# Patient Record
Sex: Female | Born: 1995 | Hispanic: No | Marital: Single | Smoking: Never smoker
Health system: Southern US, Community
[De-identification: ages and names within clinical notes are randomized; demographics above are authoritative.]

## PROBLEM LIST (undated history)

## (undated) HISTORY — PX: CYST EXCISION: SHX5701

---

## 2013-11-08 HISTORY — PX: CYST EXCISION: SHX5701

## 2016-10-17 ENCOUNTER — Emergency Department: Payer: BLUE CROSS/BLUE SHIELD

## 2016-10-17 ENCOUNTER — Inpatient Hospital Stay
Admission: EM | Admit: 2016-10-17 | Discharge: 2016-10-18 | DRG: 871 | Disposition: A | Discharge status: Home / Self Care | Payer: BLUE CROSS/BLUE SHIELD | Attending: Internal Medicine | Admitting: Internal Medicine

## 2016-10-17 DIAGNOSIS — Z793 Long term (current) use of hormonal contraceptives: Secondary | ICD-10-CM

## 2016-10-17 DIAGNOSIS — R509 Fever, unspecified: Secondary | ICD-10-CM | POA: Diagnosis not present

## 2016-10-17 DIAGNOSIS — A419 Sepsis, unspecified organism: Secondary | ICD-10-CM | POA: Diagnosis not present

## 2016-10-17 DIAGNOSIS — E871 Hypo-osmolality and hyponatremia: Secondary | ICD-10-CM | POA: Diagnosis present

## 2016-10-17 DIAGNOSIS — J189 Pneumonia, unspecified organism: Secondary | ICD-10-CM | POA: Diagnosis present

## 2016-10-17 DIAGNOSIS — D709 Neutropenia, unspecified: Secondary | ICD-10-CM | POA: Diagnosis present

## 2016-10-17 DIAGNOSIS — R5081 Fever presenting with conditions classified elsewhere: Secondary | ICD-10-CM | POA: Diagnosis present

## 2016-10-17 LAB — CBC WITH DIFFERENTIAL/PLATELET
BAND NEUTROPHILS: 2 %
BASOS ABS: 0 10*3/uL (ref 0–0.1)
BLASTS: 0 %
Basophils Relative: 0 %
EOS ABS: 0 10*3/uL (ref 0–0.7)
Eosinophils Relative: 0 %
HEMATOCRIT: 38.8 % (ref 35.0–47.0)
HEMOGLOBIN: 13.6 g/dL (ref 12.0–16.0)
LYMPHS PCT: 57 %
Lymphs Abs: 1.3 10*3/uL (ref 1.0–3.6)
MCH: 29.4 pg (ref 26.0–34.0)
MCHC: 35 g/dL (ref 32.0–36.0)
MCV: 84 fL (ref 80.0–100.0)
Metamyelocytes Relative: 2 %
Monocytes Absolute: 0.8 10*3/uL (ref 0.2–0.9)
Monocytes Relative: 36 %
Myelocytes: 0 %
NEUTROS PCT: 3 %
Neutro Abs: 0.2 10*3/uL — ABNORMAL LOW (ref 1.4–6.5)
OTHER: 0 %
PROMYELOCYTES ABS: 0 %
Platelets: 213 10*3/uL (ref 150–440)
RBC: 4.62 MIL/uL (ref 3.80–5.20)
RDW: 12.3 % (ref 11.5–14.5)
WBC: 2.3 10*3/uL — AB (ref 3.6–11.0)
nRBC: 0 /100 WBC

## 2016-10-17 LAB — URINALYSIS, COMPLETE (UACMP) WITH MICROSCOPIC
BILIRUBIN URINE: NEGATIVE
GLUCOSE, UA: NEGATIVE mg/dL
KETONES UR: 80 mg/dL — AB
LEUKOCYTES UA: NEGATIVE
NITRITE: NEGATIVE
PROTEIN: 100 mg/dL — AB
Specific Gravity, Urine: 1.014 (ref 1.005–1.030)
pH: 5 (ref 5.0–8.0)

## 2016-10-17 LAB — BASIC METABOLIC PANEL
ANION GAP: 12 (ref 5–15)
BUN: 11 mg/dL (ref 6–20)
CHLORIDE: 102 mmol/L (ref 101–111)
CO2: 20 mmol/L — ABNORMAL LOW (ref 22–32)
Calcium: 9.3 mg/dL (ref 8.9–10.3)
Creatinine, Ser: 0.78 mg/dL (ref 0.44–1.00)
GFR calc non Af Amer: >60 mL/min (ref >60)
Glucose, Bld: 107 mg/dL — ABNORMAL HIGH (ref 65–99)
Potassium: 3.6 mmol/L (ref 3.5–5.1)
Sodium: 134 mmol/L — ABNORMAL LOW (ref 135–145)

## 2016-10-17 LAB — POCT RAPID STREP A: Streptococcus, Group A Screen (Direct): NEGATIVE

## 2016-10-17 LAB — LACTIC ACID, PLASMA: Lactic Acid, Venous: 1 mmol/L (ref 0.5–1.9)

## 2016-10-17 MED ORDER — AZITHROMYCIN 250 MG PO TABS
ORAL_TABLET | ORAL | Status: AC
  Administered 2016-10-17: 500 mg via ORAL
  Filled 2016-10-17: qty 2

## 2016-10-17 MED ORDER — MAGNESIUM CITRATE PO SOLN
1.0000 | Freq: Once | ORAL | Status: DC | PRN
  Filled 2016-10-17: qty 296

## 2016-10-17 MED ORDER — ZOLPIDEM TARTRATE 5 MG PO TABS: 5.0000 mg | ORAL_TABLET | Freq: Every evening | ORAL | Status: DC | PRN

## 2016-10-17 MED ORDER — SODIUM CHLORIDE 0.9 % IV SOLN
INTRAVENOUS | Status: DC
  Administered 2016-10-17 – 2016-10-18 (×2): via INTRAVENOUS

## 2016-10-17 MED ORDER — DEXTROSE 5 % IV SOLN: 1.0000 g | Freq: Once | INTRAVENOUS | Status: DC

## 2016-10-17 MED ORDER — BISACODYL 5 MG PO TBEC: 5.0000 mg | DELAYED_RELEASE_TABLET | Freq: Every day | ORAL | Status: DC | PRN

## 2016-10-17 MED ORDER — CEFTRIAXONE SODIUM-DEXTROSE 1-3.74 GM-% IV SOLR
INTRAVENOUS | Status: AC
  Administered 2016-10-17: 1 g via INTRAVENOUS
  Filled 2016-10-17: qty 50

## 2016-10-17 MED ORDER — AZITHROMYCIN 250 MG PO TABS
500.0000 mg | ORAL_TABLET | Freq: Once | ORAL | Status: AC
  Administered 2016-10-17: 500 mg via ORAL

## 2016-10-17 MED ORDER — DEXTROSE 5 % IV SOLN
500.0000 mg | INTRAVENOUS | Status: DC
  Filled 2016-10-17: qty 500

## 2016-10-17 MED ORDER — SENNOSIDES-DOCUSATE SODIUM 8.6-50 MG PO TABS: 1.0000 | ORAL_TABLET | Freq: Every evening | ORAL | Status: DC | PRN

## 2016-10-17 MED ORDER — SODIUM CHLORIDE 0.9% FLUSH: 3.0000 mL | Freq: Two times a day (BID) | INTRAVENOUS | Status: DC

## 2016-10-17 MED ORDER — ACETAMINOPHEN 650 MG RE SUPP: 650.0000 mg | Freq: Four times a day (QID) | RECTAL | Status: DC | PRN

## 2016-10-17 MED ORDER — CEFTRIAXONE SODIUM-DEXTROSE 1-3.74 GM-% IV SOLR
1.0000 g | INTRAVENOUS | Status: DC
  Filled 2016-10-17: qty 50

## 2016-10-17 MED ORDER — DM-GUAIFENESIN ER 30-600 MG PO TB12: 1.0000 | ORAL_TABLET | Freq: Two times a day (BID) | ORAL | Status: DC

## 2016-10-17 MED ORDER — GUAIFENESIN ER 600 MG PO TB12
600.0000 mg | ORAL_TABLET | Freq: Two times a day (BID) | ORAL | Status: DC
  Administered 2016-10-17 – 2016-10-18 (×2): 600 mg via ORAL
  Filled 2016-10-17 (×2): qty 1

## 2016-10-17 MED ORDER — DEXTROMETHORPHAN POLISTIREX ER 30 MG/5ML PO SUER
30.0000 mg | Freq: Two times a day (BID) | ORAL | Status: DC
  Administered 2016-10-17 – 2016-10-18 (×2): 30 mg via ORAL
  Filled 2016-10-17 (×3): qty 5

## 2016-10-17 MED ORDER — ONDANSETRON HCL 4 MG PO TABS
4.0000 mg | ORAL_TABLET | Freq: Four times a day (QID) | ORAL | Status: DC | PRN
Start: 2016-10-17 — End: 2016-10-18

## 2016-10-17 MED ORDER — IPRATROPIUM-ALBUTEROL 0.5-2.5 (3) MG/3ML IN SOLN: 3.0000 mL | Freq: Four times a day (QID) | RESPIRATORY_TRACT | Status: DC | PRN

## 2016-10-17 MED ORDER — ACETAMINOPHEN 325 MG PO TABS
650.0000 mg | ORAL_TABLET | Freq: Four times a day (QID) | ORAL | Status: DC | PRN
  Administered 2016-10-17: 650 mg via ORAL
  Filled 2016-10-17: qty 2

## 2016-10-17 MED ORDER — ONDANSETRON HCL 4 MG/2ML IJ SOLN: 4.0000 mg | Freq: Four times a day (QID) | INTRAMUSCULAR | Status: DC | PRN

## 2016-10-17 MED ORDER — CEFTRIAXONE SODIUM-DEXTROSE 1-3.74 GM-% IV SOLR
1.0000 g | Freq: Once | INTRAVENOUS | Status: AC
  Administered 2016-10-17: 1 g via INTRAVENOUS
  Filled 2016-10-17: qty 50

## 2016-10-17 NOTE — ED Provider Notes (Signed)
[facility] Emergency Department Provider Note  ____________________________________________  Time seen: Approximately 5:51 PM  I have reviewed the triage vital signs and the nursing notes.   HISTORY  Chief Complaint Fever   HPI Abigail Pennington is a 20 y.o. female with no significant past medical history who presents for evaluation of fever and headache. Patient reports that her symptoms started yesterday. She has had a fever as high as 104F. patient reports that she had a severe headache, diffuse, throbbing, nonradiating, constant for most of the day today. She reports that [facility] to [facility] and was given 800 mg of Motrin at 4 PM, had flu and strep which were both negative and was sent here for further evaluation. Patient endorses mild sore throat and a dry cough for the last 2 days as well. She tells me that she feels great now and that HA has fully resolved after motrin. She denies neck stiffness, rash, shortness of breath, chest pain, changes in vision, abdominal pain, diarrhea. Patient did have nausea and 2 episodes of vomiting while her fever was really high. She denies any nausea at this time. Patient's vaccinations are up-to-date including influenza. 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  History reviewed. No pertinent past medical history.  Patient Active Problem List   Diagnosis Date Noted  . Sepsis due to pneumonia (HCC) 10/17/2016    Past Surgical History:  Procedure Laterality Date  . CYST EXCISION      Prior to Admission medications   Not on File    Allergies Patient has no known allergies.  No family history on file.  Social History Social History  Substance Use Topics  . Smoking status: Never Smoker  . Smokeless tobacco: Never Used  . Alcohol use No    Review of Systems  Constitutional: + fever. Eyes: Negative for visual changes. ENT: + sore throat. Neck: No neck pain   Cardiovascular: Negative for chest pain. Respiratory: Negative for shortness of breath. + cough Gastrointestinal: Negative for abdominal pain, vomiting or diarrhea. Genitourinary: Negative for dysuria. Musculoskeletal: Negative for back pain. Skin: Negative for rash. Neurological: Negative for weakness or numbness. + HA Psych: No SI or HI  ____________________________________________   PHYSICAL EXAM:  VITAL SIGNS: ED Triage Vitals  Enc Vitals Group     BP 10/17/16 1643 118/81     Pulse Rate 10/17/16 1643 (!) 145     Resp 10/17/16 1643 18     Temp 10/17/16 1643 (!) 102.6 F (39.2 C)     Temp Source 10/17/16 1643 Oral     SpO2 10/17/16 1643 96 %     Weight 10/17/16 1644 111 lb (50.3 kg)     Height 10/17/16 1644 5' 5.75" (1.67 m)     Head Circumference --      Peak Flow --      Pain Score 10/17/16 1644 2     Pain Loc --      Pain Edu? --      Excl. in GC? --     Constitutional: Alert and oriented. Well appearing and in no apparent distress. HEENT:      Head: Normocephalic and atraumatic.         Eyes: Conjunctivae are normal. Sclera is non-icteric. EOMI. PERRL      Mouth/Throat: Mucous membranes are moist. Oropharynx is erythematous with small amount of exudates, no evidence of peritonsillar abscess  Neck: Supple with no signs of meningismus. Negative jolt accentuation maneuver Cardiovascular: Regular rate and rhythm. No murmurs, gallops, or rubs. 2+ symmetrical distal pulses are present in all extremities. No JVD. Respiratory: Normal respiratory effort. Lungs are clear to auscultation bilaterally. No wheezes, crackles, or rhonchi.  Gastrointestinal: Soft, non tender, and non distended with positive bowel sounds. No rebound or guarding. Musculoskeletal: Nontender with normal range of motion in all extremities. No edema, cyanosis, or erythema of extremities. Neurologic: Normal speech and language. A & O x3, PERRL, no nystagmus, CN II-XII intact, motor testing reveals  good tone and bulk throughout. There is no evidence of pronator drift or dysmetria. Muscle strength is 5/5 throughout. Deep tendon reflexes are 2+ throughout with downgoing toes. Sensory examination is intact. Gait is normal. Skin: Skin is warm, dry and intact. No rash noted. Psychiatric: Mood and affect are normal. Speech and behavior are normal.  ____________________________________________   LABS (all labs ordered are listed, but only abnormal results are displayed)  Labs Reviewed  CBC WITH DIFFERENTIAL/PLATELET - Abnormal; Notable for the following:       Result Value   WBC 2.3 (*)    Neutro Abs 0.2 (*)    All other components within normal limits  BASIC METABOLIC PANEL - Abnormal; Notable for the following:    Sodium 134 (*)    CO2 20 (*)    Glucose, Bld 107 (*)    All other components within normal limits  URINALYSIS, COMPLETE (UACMP) WITH MICROSCOPIC - Abnormal; Notable for the following:    Color, Urine YELLOW (*)    APPearance HAZY (*)    Hgb urine dipstick MODERATE (*)    Ketones, ur 80 (*)    Protein, ur 100 (*)    Bacteria, UA RARE (*)    Squamous Epithelial / LPF 6-30 (*)    All other components within normal limits  CULTURE, GROUP A STREP (THRC)  LACTIC ACID, PLASMA  POCT RAPID STREP A   ____________________________________________  EKG  none ____________________________________________  RADIOLOGY  CXR : Subtle airspace consolidation the right middle lobe which may represent lobar pneumonia. Alternatively similar appearance can be seen with pectus excavatum. ____________________________________________   PROCEDURES  Procedure(s) performed: None Procedures Critical Care performed: yes  CRITICAL CARE Performed by: Nita Sickle  ?  Total critical care time: 35 min  Critical care time was exclusive of separately billable procedures and treating other patients.  Critical care was necessary to treat or prevent imminent or life-threatening  deterioration.  Critical care was time spent personally by me on the following activities: development of treatment plan with patient and/or surrogate as well as nursing, discussions with consultants, evaluation of patient's response to treatment, examination of patient, obtaining history from patient or surrogate, ordering and performing treatments and interventions, ordering and review of laboratory studies, ordering and review of radiographic studies, pulse oximetry and re-evaluation of patient's condition.  ____________________________________________   INITIAL IMPRESSION / ASSESSMENT AND PLAN / ED COURSE  20 y.o. female with no significant past medical history who presents for evaluation of fever,headache, sore throat and cough. Patient is extremely well-appearing, neurologically intact, no meningeal signs, no neck stiffness, negative jolt accentuation maneuver, no rash. She does have small amount of exudates and erythema of her oropharynx plus the coughing makes me concerned for a viral URI. I did discuss meningitis as a potential diagnosis with the patient however she is so well-appearing and I have very low suspicion at this time. I will repeat the strep,  the chest x-ray, basic blood work and continue to monitor patient closely.  Clinical Course as of Oct 17 2136  Sun Oct 17, 2016  1917 CXR concerning for PNA, WBC 2.3 (pending diff at this time), CXR concerning for PNA. Strep and lactic negative. I offered to patient and mother admission for IV abx and obs overnight however patient reports that she feels great and she wants to go home, she remains with no meningeal signs, no headache, neurologically intact otherwise. Mother feels comfortable taking her home. She will [facility]. I have discussed very strict return precautions with family. I will wait for the results of the differential to make sure patient ANC is normal befaore discharge. Will start patient  on Augmentin.  [CV]  2024 Patient is neutropenic with ANC of 0.2 therefore we'll admit for IV antibiotics.  [CV]    Clinical Course User Index [CV] Nita Sicklearolina Chrishun Scheer, MD    Pertinent labs & imaging results that were available during my care of the patient were reviewed by me and considered in my medical decision making (see chart for details).    ____________________________________________   FINAL CLINICAL IMPRESSION(S) / ED DIAGNOSES  Final diagnoses:  Neutropenic fever (HCC)  Community acquired pneumonia, unspecified laterality      NEW MEDICATIONS STARTED DURING THIS VISIT:  New Prescriptions   No medications on file     Note:  This document was prepared using Dragon voice recognition software and may include unintentional dictation errors.    Nita Sicklearolina Emali Heyward, MD 10/17/16 2137

## 2016-10-17 NOTE — ED Notes (Signed)
Patient transported to X-ray 

## 2016-10-17 NOTE — ED Notes (Signed)
Report called to Jackie, RN.

## 2016-10-17 NOTE — Progress Notes (Signed)
Pharmacy Antibiotic Note  Abigail Pennington is a 20 y.o. female admitted on 10/17/2016 with pneumonia.  Pharmacy has been consulted for ceftriaxone dosing.  Plan: Ceftriaxone 1 gram q 24 hours ordered.  Height: 5' 5.75" (167 cm) Weight: 111 lb (50.3 kg) IBW/kg (Calculated) : 58.72  Temp (24hrs), Avg:100.4 F (38 C), Min:98.9 F (37.2 C), Max:102.6 F (39.2 C)   Recent Labs Lab 10/17/16 1818  WBC 2.3*  CREATININE 0.78  LATICACIDVEN 1.0    Estimated Creatinine Clearance: 87.6 mL/min (by C-G formula based on SCr of 0.78 mg/dL).    No Known Allergies  Antimicrobials this admission: ceftriaxone 12/10 >>  azithromycin 12/10 >>   Dose adjustments this admission:   Microbiology results:  BCx:   UCx:   12/10 Sputum: pending   MRSA PCR:     12/10 UA: (-)   Thank you for allowing pharmacy to be a part of this patient's care.  Wyndell Cardiff S 10/17/2016 10:28 PM

## 2016-10-17 NOTE — ED Triage Notes (Signed)
[facility] with c/o high fever since yesterday with HA.Marland Kitchen. Pt temp 104 at clinic given IBU at 4p, temp now 102.6.Marland Kitchen. Tested neg for strep and flu..Marland Kitchen

## 2016-10-17 NOTE — H&P (Addendum)
[facility] [place] [facility] Ad[facility] History and Physical [employer] Shelena Castelluccio, D.O.  ---------------------------------------------------------------------------------------------------------------------   PATIENT NAMGrace Pennington: Abigail Pennington MR#: [id number] DATE OF BIRTH: 07/15/1996 DATE OF ADMISSION: 10/17/2016 PRIMARY CARE PHYSICIAN: Pcp Not In System  REQUESTING/REFERRING PHYSICIAN: ED Dr. Don PerkingVeronese  CHIEF COMPLAINT: Chief Complaint  Patient presents with  . Fever    HISTORY OF PRESENT ILLNESS: Abigail BlightChrysoula Pennington is a 20 y.o. female with No known past medical history presents to the emergency department complaining of fever and headache.  Patient was in a usual state of health until yesterday when she developed headache described as throbbing, constant with intermittent exacerbations and associated with diffuse body aches and nausea as well as a temperature to 104. She also reports mild dry cough and sore throat or the past 2 days and 2 episodes of vomiting today. He has not taken any medication for her symptoms. Today she was seen at an urgent care where she was tested for influenza and strep. Both were negative however she was given 800 mg of Motrin and referred to the emergency department for additional workup due to high fever.  She denies abdominal pain, chest pain, shortness of breath, constipation, diarrhea, dysuria, frequency, neck stiffness, lower extremity swelling, rash.  Otherwise there has been no change in status. Patient has been taking medication as prescribed and there has been no recent change in medication or diet.  There has been no recent illness, travel or sick contacts.  No new sexual contacts and no history of sexually transmitted infections. Patient's immunizations are up-to-date. She is a Archivistcollege student, [employer]ying journalism, works in [employer], lives off campus with her mother and sister. Her boyfriend is visiting.  Patient is feeling significantly improved in the  emergency department with resolution of headache however she was found to be neutropenic and therefore hospitalists were contacted requesting admission.  PAST MEDICAL HISTORY: Negative    PAST SURGICAL HISTORY: Past Surgical History:  Procedure Laterality Date  . CYST EXCISION        SOCIAL HISTORY: Social History  Substance Use Topics  . Smoking status: Never Smoker  . Smokeless tobacco: Never Used  . Alcohol use No  Patient denies alcohol, tobacco and drug use Patient is sexually active with one partner, denies any new sexual risk factors. She has an annual GYN exam and is tested daily for STDs but has not been tested for HIV.   FAMILY HISTORY: Patient denies any family medical problems in her first-degree relatives.   MEDICATIONS AT HOME: Prior to Admission medications   Not on File  Patient takes oral contraceptives    DRUG ALLERGIES: No Known Allergies   REVIEW OF SYSTEMS: CONSTITUTIONAL: No fatigue, weakness, chills, weight gain/loss. Positive generalized malaise, body aches, fever and headache EYES: No blurry or double vision. ENT: No tinnitus, postnasal drip, redness or soreness of the oropharynx. RESPIRATORY: Positive mild dry cough. No dyspnea, wheeze, hemoptysis. CARDIOVASCULAR: No chest pain, orthopnea, palpitations, syncope. GASTROINTESTINAL: Positive nausea, vomiting 2 today. Negative constipation, diarrhea, abdominal pain. No hematemesis, melena or hematochezia. GENITOURINARY: No dysuria, frequency, hematuria. ENDOCRINE: No polyuria or nocturia. No heat or cold intolerance. HEMATOLOGY: No anemia, bruising, bleeding. INTEGUMENTARY: No rashes, ulcers, lesions. MUSCULOSKELETAL: No pain, arthritis, swelling, gout. NEUROLOGIC: No numbness, tingling, weakness or ataxia. No seizure-type activity. PSYCHIATRIC: No anxiety, depression, insomnia.  PHYSICAL EXAMINATION: VITAL SIGNS: Blood pressure 118/81, pulse 95, temperature (!) 100.4 F (38 C), temperature  source Oral, resp. rate 18, height 5' 5.75" (1.67 m), weight 50.3 kg (  111 lb), last menstrual period 10/04/2016, SpO2 99 %.  GENERAL: 20 y.o.-year-old  white female patient, well-developed, well-nourished lying in the bed in no acute distress.  Pleasant and cooperative.   HEENT: Head atraumatic, normocephalic. Pupils equal, round, reactive to light and accommodation. No scleral icterus. Extraocular muscles intact. Oropharynx is clear. Mucus membranes moist. NECK: Supple, full range of motion. No JVD, no bruit heard. No cervical lymphadenopathy. CHEST: Normal breath sounds bilaterally. No wheezing, rales, rhonchi or crackles. No use of accessory muscles of respiration.  No reproducible chest wall tenderness.  CARDIOVASCULAR: S1, S2 normal. No murmurs, rubs, or gallops appreciated. Cap refill <2 seconds. ABDOMEN: Soft, nontender, nondistended. No rebound, guarding, rigidity. Normoactive bowel sounds present in all four quadrants. No organomegaly or mass. EXTREMITIES: Full range of motion. No pedal edema, cyanosis, or clubbing. NEUROLOGIC: Cranial nerves II through XII are grossly intact with no focal sensorimotor deficit. Muscle strength 5/5 in all extremities. Sensation intact. Gait not checked. PSYCHIATRIC: The patient is alert and oriented x 3. Normal affect, mood, thought content. SKIN: Warm, dry, and intact without obvious rash, lesion, or ulcer.  LABORATORY PANEL:  CBC  Recent Labs Lab 10/17/16 1818  WBC 2.3*  HGB 13.6  HCT 38.8  PLT 213   ----------------------------------------------------------------------------------------------------------------- Chemistries  Recent Labs Lab 10/17/16 1818  NA 134*  K 3.6  CL 102  CO2 20*  GLUCOSE 107*  BUN 11  CREATININE 0.78  CALCIUM 9.3   ------------------------------------------------------------------------------------------------------------------ Cardiac Enzymes No results for input(s): TROPONINI in the last 168  hours. ------------------------------------------------------------------------------------------------------------------  RADIOLOGY: Dg Chest 2 View  Result Date: 10/17/2016 CLINICAL DATA:  Fever and headache for 2 days. EXAM: CHEST  2 VIEW COMPARISON:  None. FINDINGS: Cardiomediastinal silhouette is normal. Mediastinal contours appear intact. There is no evidence of pleural effusion or pneumothorax. There is a subtle airspace consolidation in the right middle lobe. Osseous structures are without acute abnormality. Soft tissues are grossly normal. IMPRESSION: Subtle airspace consolidation the right middle lobe which may represent lobar pneumonia. Alternatively similar appearance can be seen with pectus excavatum. Electronically Signed   By: Ted Mcalpineobrinka  Dimitrova M.D.   On: 10/17/2016 18:21    IMPRESSION AND PLAN:  This is a 20 y.o. female with No significant past medical historynow being admitted with: 1.  Sepsis secondary to community-acquired pneumonia. Patient has been febrile, tachycardic with leukopenia, neutropenia and radial graphic evidence of right middle lobe pneumonia. I will admit for IV fluid hydration, IV antibiotics with Rocephin and Zithromax, sputum cultures, antipyretics and pain control. When necessary nebulizers and expectorants. 2.  Neutropenia-neutropenic precautions, repeat CBC with differential in a.m. Add on LFTs to prior labs.   Offered HIV testing which the patient agrees to while hospitalized.Consider hematology evaluation if not improving. 3. Hyponatremia, mild-IV fluids and recheck BMP in the a.m. 4. Patient may continue her oral birth control pill.  Diet/Nutrition:  Regular Fluids:  IV normal saline DVT Px:  Low risk: SCDs and early ambulation Code Status: Full  All the records are reviewed and case discussed with ED provider. Management plans discussed with the patient and her family who express understanding and agree with plan of care.   TOTAL TIME TAKING  CARE OF THIS PATIENT: 60 minutes.   Erdine Hulen D.O. on 10/17/2016 at 8:52 PM Between 7am to 6pm - Pager - [phone] After 6pm go to [email or web address] - Social research officer, governmentpassword EPAS [facility] [place] [facility] Office [phone] CC: Primary care physician; Pcp Not In System     Note: This  dictation was prepared with Dragon dictation along with smaller phrase technology. Any transcriptional errors that result from this process are unintentional.

## 2016-10-18 LAB — BASIC METABOLIC PANEL
Anion gap: 7 (ref 5–15)
BUN: 9 mg/dL (ref 6–20)
CALCIUM: 8.7 mg/dL — AB (ref 8.9–10.3)
CHLORIDE: 108 mmol/L (ref 101–111)
CO2: 22 mmol/L (ref 22–32)
CREATININE: 0.67 mg/dL (ref 0.44–1.00)
Glucose, Bld: 108 mg/dL — ABNORMAL HIGH (ref 65–99)
Potassium: 3.2 mmol/L — ABNORMAL LOW (ref 3.5–5.1)
SODIUM: 137 mmol/L (ref 135–145)

## 2016-10-18 LAB — HEPATIC FUNCTION PANEL
ALT: 13 U/L — ABNORMAL LOW (ref 14–54)
AST: 16 U/L (ref 15–41)
Albumin: 4.3 g/dL (ref 3.5–5.0)
Alkaline Phosphatase: 42 U/L (ref 38–126)
BILIRUBIN DIRECT: 0.3 mg/dL (ref 0.1–0.5)
BILIRUBIN INDIRECT: 1.4 mg/dL — AB (ref 0.3–0.9)
TOTAL PROTEIN: 7.9 g/dL (ref 6.5–8.1)
Total Bilirubin: 1.7 mg/dL — ABNORMAL HIGH (ref 0.3–1.2)

## 2016-10-18 LAB — INFLUENZA PANEL BY PCR (TYPE A & B)
INFLBPCR: NEGATIVE
Influenza A By PCR: NEGATIVE

## 2016-10-18 LAB — CBC WITH DIFFERENTIAL/PLATELET
BASOS ABS: 0 10*3/uL (ref 0–0.1)
Band Neutrophils: 9 %
Basophils Relative: 0 %
Blasts: 0 %
EOS PCT: 2 %
Eosinophils Absolute: 0.1 10*3/uL (ref 0–0.7)
HEMATOCRIT: 36.1 % (ref 35.0–47.0)
Hemoglobin: 12.4 g/dL (ref 12.0–16.0)
LYMPHS ABS: 1.3 10*3/uL (ref 1.0–3.6)
Lymphocytes Relative: 50 %
MCH: 29.5 pg (ref 26.0–34.0)
MCHC: 34.3 g/dL (ref 32.0–36.0)
MCV: 85.9 fL (ref 80.0–100.0)
METAMYELOCYTES PCT: 1 %
MONO ABS: 0.8 10*3/uL (ref 0.2–0.9)
MYELOCYTES: 0 %
Monocytes Relative: 28 %
NEUTROS PCT: 10 %
Neutro Abs: 0.5 10*3/uL — ABNORMAL LOW (ref 1.4–6.5)
Other: 0 %
PLATELETS: 185 10*3/uL (ref 150–440)
PROMYELOCYTES ABS: 0 %
RBC: 4.21 MIL/uL (ref 3.80–5.20)
RDW: 12.6 % (ref 11.5–14.5)
WBC: 2.7 10*3/uL — AB (ref 3.6–11.0)
nRBC: 1 /100 WBC — ABNORMAL HIGH

## 2016-10-18 LAB — RAPID HIV SCREEN (HIV 1/2 AB+AG)
HIV 1/2 ANTIBODIES: NONREACTIVE
HIV-1 P24 Antigen - HIV24: NONREACTIVE

## 2016-10-18 MED ORDER — AZITHROMYCIN 250 MG PO TABS
250.0000 mg | ORAL_TABLET | Freq: Every day | ORAL | Status: DC
  Administered 2016-10-18: 250 mg via ORAL
  Filled 2016-10-18: qty 1

## 2016-10-18 MED ORDER — CEFUROXIME AXETIL 500 MG PO TABS: 500.0000 mg | ORAL_TABLET | Freq: Two times a day (BID) | ORAL | 0 refills | Status: DC

## 2016-10-18 MED ORDER — CEFTRIAXONE SODIUM 1 G IJ SOLR: 1.0000 g | Freq: Once | INTRAMUSCULAR | Status: DC

## 2016-10-18 MED ORDER — CEFTRIAXONE SODIUM-DEXTROSE 1-3.74 GM-% IV SOLR
1.0000 g | INTRAVENOUS | Status: DC
  Administered 2016-10-18: 1 g via INTRAVENOUS
  Filled 2016-10-18: qty 50

## 2016-10-18 MED ORDER — AZITHROMYCIN 250 MG PO TABS: ORAL_TABLET | ORAL | 0 refills | Status: DC

## 2016-10-18 NOTE — Progress Notes (Signed)
On assessment patient alert and oriented. Patient denies SOB, chills, pain, or weakness. RN educated patient and family members on protective precautions and asked visitors not to bring in flowers, raw fruits and vegetables, and to not enter is sick. All questions answered. Patient Flu PCR result is negative. Patient lung sounds clear.  Patient on room air.   Harvie HeckMelanie Silver Achey, RN

## 2016-10-18 NOTE — Discharge Summary (Signed)
[facility] - [place] at [facility]   PATIENT NAME: Abigail BlightChrysoula Pennington    MR#:  [id number]  DATE OF BIRTH:  05/08/1996  DATE OF ADMISSION:  10/17/2016 ADMITTING PHYSICIAN: Jon GillsAlexis Hugelmeyer, DO  DATE OF DISCHARGE: 10/18/2016  1:31 PM  PRIMARY CARE PHYSICIAN: Pcp Not In System    ADMISSION DIAGNOSIS:  Neutropenic fever (HCC) [D70.9, R50.81] Community acquired pneumonia, unspecified laterality [J18.9]  DISCHARGE DIAGNOSIS:  Active Problems:   Sepsis due to pneumonia (HCC)   SECONDARY DIAGNOSIS:  History reviewed. No pertinent past medical history.  HOSPITAL COURSE:   1. Clinical sepsis, community-acquired pneumonia. Fever, tachycardia and leukopenia. Patient feeling much better and wants to go home. I gave her a second dose of Rocephin and Zithromax prior to disposition and switched over to Ceftin and Zithromax to complete a course. I added on an Epstein-Barr profile to labs drawn this morning. 2. Patient was placed on neutropenic precautions. HIV test was negative. Differential shows bands and lymphocyte predominance which could be a viral infection. Flu swab was negative. Recommend following up a CBC as outpatient and follow-up appointment. 3. Hyponatremia improved with IV fluids. 4. Patient to continue birth control pill. With antibiotics the deficiency of the birth control pill may be reduced.  DISCHARGE CONDITIONS:   Satisfactory  CONSULTS OBTAINED:   none  DRUG ALLERGIES:  No Known Allergies  DISCHARGE MEDICATIONS:   Discharge Medication List as of 10/18/2016 12:56 PM    START taking these medications   Details  azithromycin (ZITHROMAX) 250 MG tablet One tablet daily for three days, Print    cefUROXime (CEFTIN) 500 MG tablet Take 1 tablet (500 mg total) by mouth 2 (two) times daily with a meal., Starting Mon 10/18/2016, Print      CONTINUE these medications which have NOT CHANGED   Details  LO LOESTRIN FE 1 MG-10 MCG / 10 MCG tablet Take 1  tablet by mouth daily., Starting Wed 08/11/2016, Historical Med         DISCHARGE INSTRUCTIONS:   Follow-up PMD in one week  If you experience worsening of your admission symptoms, develop shortness of breath, life threatening emergency, suicidal or homicidal thoughts you must seek medical attention immediately by calling 911 or calling your MD immediately  if symptoms less severe.  You Must read complete instructions/literature along with all the possible adverse reactions/side effects for all the Medicines you take and that have been prescribed to you. Take any new Medicines after you have completely understood and accept all the possible adverse reactions/side effects.   Please note  You were cared for by a hospitalist during your hospital stay. If you have any questions about your discharge medications or the care you received while you were in the hospital after you are discharged, you can call the unit and asked to speak with the hospitalist on call if the hospitalist that took care of you is not available. Once you are discharged, your primary care physician will handle any further medical issues. Please note that NO REFILLS for any discharge medications will be authorized once you are discharged, as it is imperative that you return to your primary care physician (or establish a relationship with a primary care physician if you do not have one) for your aftercare needs so that they can reassess your need for medications and monitor your lab values.    Today   CHIEF COMPLAINT:   Chief Complaint  Patient presents with  . Fever    HISTORY OF PRESENT  ILLNESS:  Abigail Pennington  is a 20 y.o. female with a known history of Presented with fever and headache and found to have a pneumonia.   VITAL SIGNS:  Blood pressure 109/61, pulse 87, temperature 98.3 F (36.8 C), temperature source Oral, resp. rate 17, height 5' 5.75" (1.67 m), weight 50.3 kg (111 lb), last menstrual period  10/04/2016, SpO2 100 %.    PHYSICAL EXAMINATION:  GENERAL:  20 y.o.-year-old patient lying in the bed with no acute distress.  EYES: Pupils equal, round, reactive to light and accommodation. No scleral icterus. Extraocular muscles intact.  HEENT: Head atraumatic, normocephalic.  Throat erythema and no exudates. Tonsils 3+ NECK:  Supple, no jugular venous distention. No thyroid enlargement, no tenderness.  LUNGS: Normal breath sounds bilaterally, no wheezing, rales,rhonchi or crepitation. No use of accessory muscles of respiration.  CARDIOVASCULAR: S1, S2 normal. No murmurs, rubs, or gallops.  ABDOMEN: Soft, non-tender, non-distended. Bowel sounds present. No organomegaly or mass.  EXTREMITIES: No pedal edema, cyanosis, or clubbing.  NEUROLOGIC: Cranial nerves II through XII are intact. Muscle strength 5/5 in all extremities. Sensation intact. Gait not checked. Negative Kernig's and Brudzinski sign PSYCHIATRIC: The patient is alert and oriented x 3.  SKIN: No obvious rash, lesion, or ulcer.   DATA REVIEW:   CBC  Recent Labs Lab 10/18/16 0326  WBC 2.7*  HGB 12.4  HCT 36.1  PLT 185    Chemistries   Recent Labs Lab 10/17/16 1818 10/18/16 0326  NA 134* 137  K 3.6 3.2*  CL 102 108  CO2 20* 22  GLUCOSE 107* 108*  BUN 11 9  CREATININE 0.78 0.67  CALCIUM 9.3 8.7*  AST 16  --   ALT 13*  --   ALKPHOS 42  --   BILITOT 1.7*  --      RADIOLOGY:  Dg Chest 2 View  Result Date: 10/17/2016 CLINICAL DATA:  Fever and headache for 2 days. EXAM: CHEST  2 VIEW COMPARISON:  None. FINDINGS: Cardiomediastinal silhouette is normal. Mediastinal contours appear intact. There is no evidence of pleural effusion or pneumothorax. There is a subtle airspace consolidation in the right middle lobe. Osseous structures are without acute abnormality. Soft tissues are grossly normal. IMPRESSION: Subtle airspace consolidation the right middle lobe which may represent lobar pneumonia. Alternatively  similar appearance can be seen with pectus excavatum. Electronically Signed   By: Ted Mcalpineobrinka  Dimitrova M.D.   On: 10/17/2016 18:21   No orders found for this or any previous visit.    Management plans discussed with the patient, family and they are in agreement.  CODE STATUS:     Code Status Orders        Start     Ordered   10/17/16 2215  Full code  Continuous     10/17/16 2215    Code Status History    Date Active Date Inactive Code Status Order ID Comments User Context   This patient has a current code status but no historical code status.      TOTAL TIME TAKING CARE OF THIS PATIENT: 35 minutes.    Alford HighlandWIETING, Jarika Robben M.D on 10/18/2016 at 4:27 PM  Between 7am to 6pm - Pager - [phone]  After 6pm go to [email or web address] - password [email or web address]  [facility] Office  [phone]  CC: Primary care physician; Pcp Not In System

## 2016-10-18 NOTE — Progress Notes (Signed)
Patient ID: Abigail BlightChrysoula Pennington, female   DOB: 05/20/1996, 20 y.o.   MRN: [id number] [facility] [place] at [facility]        Abigail Pennington was admitted to the Hospital on 10/17/2016 and Discharged  10/18/2016 and should be excused from work/school   for 7 days starting 10/17/2016 , may return to work/school without any restrictions.  Alford HighlandWIETING, Abigail Sean M.D on 10/18/2016,at 11:45 AM  [facility] - [place] at [facility]    Office  [phone]

## 2016-10-18 NOTE — Progress Notes (Signed)
Abigail Pennington to be D/C'd Home per MD order.  Discussed with the patient and all questions fully answered.  VSS, Skin clean, dry and intact without evidence of skin break down, no evidence of skin tears noted. IV catheter discontinued intact. Site without signs and symptoms of complications. Dressing and pressure applied.  An After Visit Summary was printed and given to the patient. Patient received prescription.  D/c education completed with patient/family including follow up instructions, medication list, d/c activities limitations if indicated, with other d/c instructions as indicated by MD - patient able to verbalize understanding, all questions fully answered.   Patient instructed to return to ED, call 911, or call MD for any changes in condition.   Patient escorted via WC, and D/C home via private auto.  Abigail Pennington 10/18/2016 1:16 PM

## 2016-10-18 NOTE — Discharge Instructions (Addendum)
Can take advil if any further fever.  Community-Acquired Pneumonia, Adult Introduction Pneumonia is an infection of the lungs. One type of pneumonia can happen while a person is in a hospital. A different type can happen when a person is not in a hospital (community-acquired pneumonia). It is easy for this kind to spread from person to person. It can spread to you if you breathe near an infected person who coughs or sneezes. Some symptoms include:  A dry cough.  A wet (productive) cough.  Fever.  Sweating.  Chest pain. Follow these instructions at home:  Take over-the-counter and prescription medicines only as told by your doctor.  Only take cough medicine if you are losing sleep.  If you were prescribed an antibiotic medicine, take it as told by your doctor. Do not stop taking the antibiotic even if you start to feel better.  Sleep with your head and neck raised (elevated). You can do this by putting a few pillows under your head, or you can sleep in a recliner.  Do not use tobacco products. These include cigarettes, chewing tobacco, and e-cigarettes. If you need help quitting, ask your doctor.  Drink enough water to keep your pee (urine) clear or pale yellow. A shot (vaccine) can help prevent pneumonia. Shots are often suggested for:  People older than 22 years of age.  People older than 22 years of age:  Who are having cancer treatment.  Who have long-term (chronic) lung disease.  Who have problems with their body's defense system (immune system). You may also prevent pneumonia if you take these actions:  Get the flu (influenza) shot every year.  Go to the dentist as often as told.  Wash your hands often. If soap and water are not available, use hand sanitizer. Contact a doctor if:  You have a fever.  You lose sleep because your cough medicine does not help. Get help right away if:  You are short of breath and it gets worse.  You have more chest pain.  Your  sickness gets worse. This is very serious if:  You are an older adult.  Your body's defense system is weak.  You cough up blood. This information is not intended to replace advice given to you by your health care provider. Make sure you discuss any questions you have with your health care provider. Document Released: 04/12/2008 Document Revised: 04/01/2016 Document Reviewed: 02/19/2015  2017 Elsevier

## 2016-10-19 LAB — EPSTEIN-BARR VIRUS VCA ANTIBODY PANEL
EBV Early Antigen Ab, IgG: <9 U/mL (ref 0.0–8.9)
EBV NA IgG: 267 U/mL — ABNORMAL HIGH (ref 0.0–17.9)
EBV VCA IgG: 40.3 U/mL — ABNORMAL HIGH (ref 0.0–17.9)

## 2016-10-20 LAB — CULTURE, GROUP A STREP (THRC)

## 2017-10-06 ENCOUNTER — Ambulatory Visit
Admission: RE | Admit: 2017-10-06 | Discharge: 2017-10-06 | Disposition: A | Discharge status: Home / Self Care | Payer: BLUE CROSS/BLUE SHIELD | Source: Ambulatory Visit | Attending: Family Medicine | Admitting: Family Medicine

## 2017-10-06 ENCOUNTER — Other Ambulatory Visit: Payer: Self-pay | Admitting: Family Medicine

## 2017-10-06 DIAGNOSIS — R0602 Shortness of breath: Secondary | ICD-10-CM

## 2019-07-10 IMAGING — CR DG CHEST 2V
2 series · 2 of 2 positions shown · non-contrast
Comparison: Radiograph 10/17/2016.

CLINICAL DATA: Shortness of breath.  Increased fatigue.

EXAM:
CHEST  2 VIEW

[w chest pa]
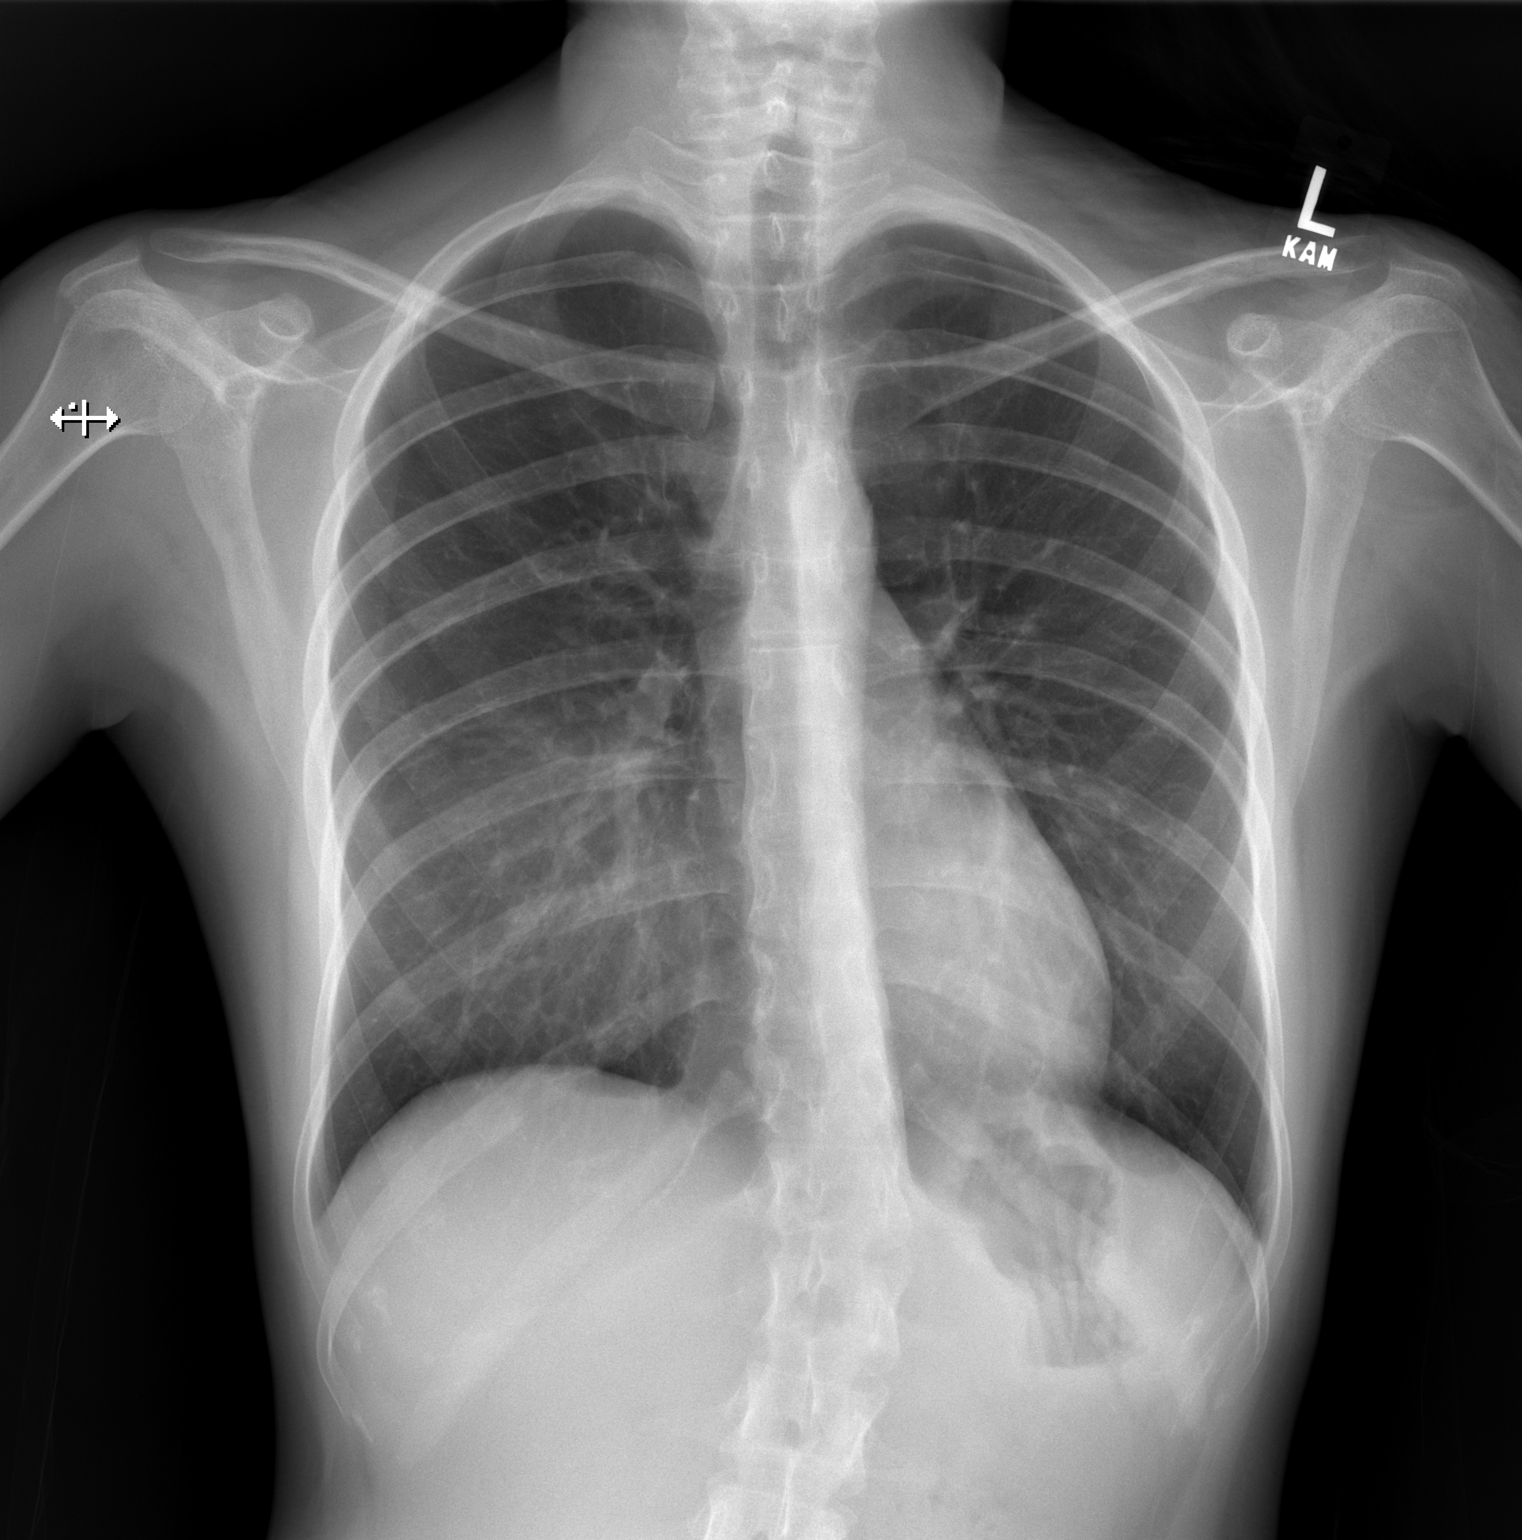

[w chest lat]
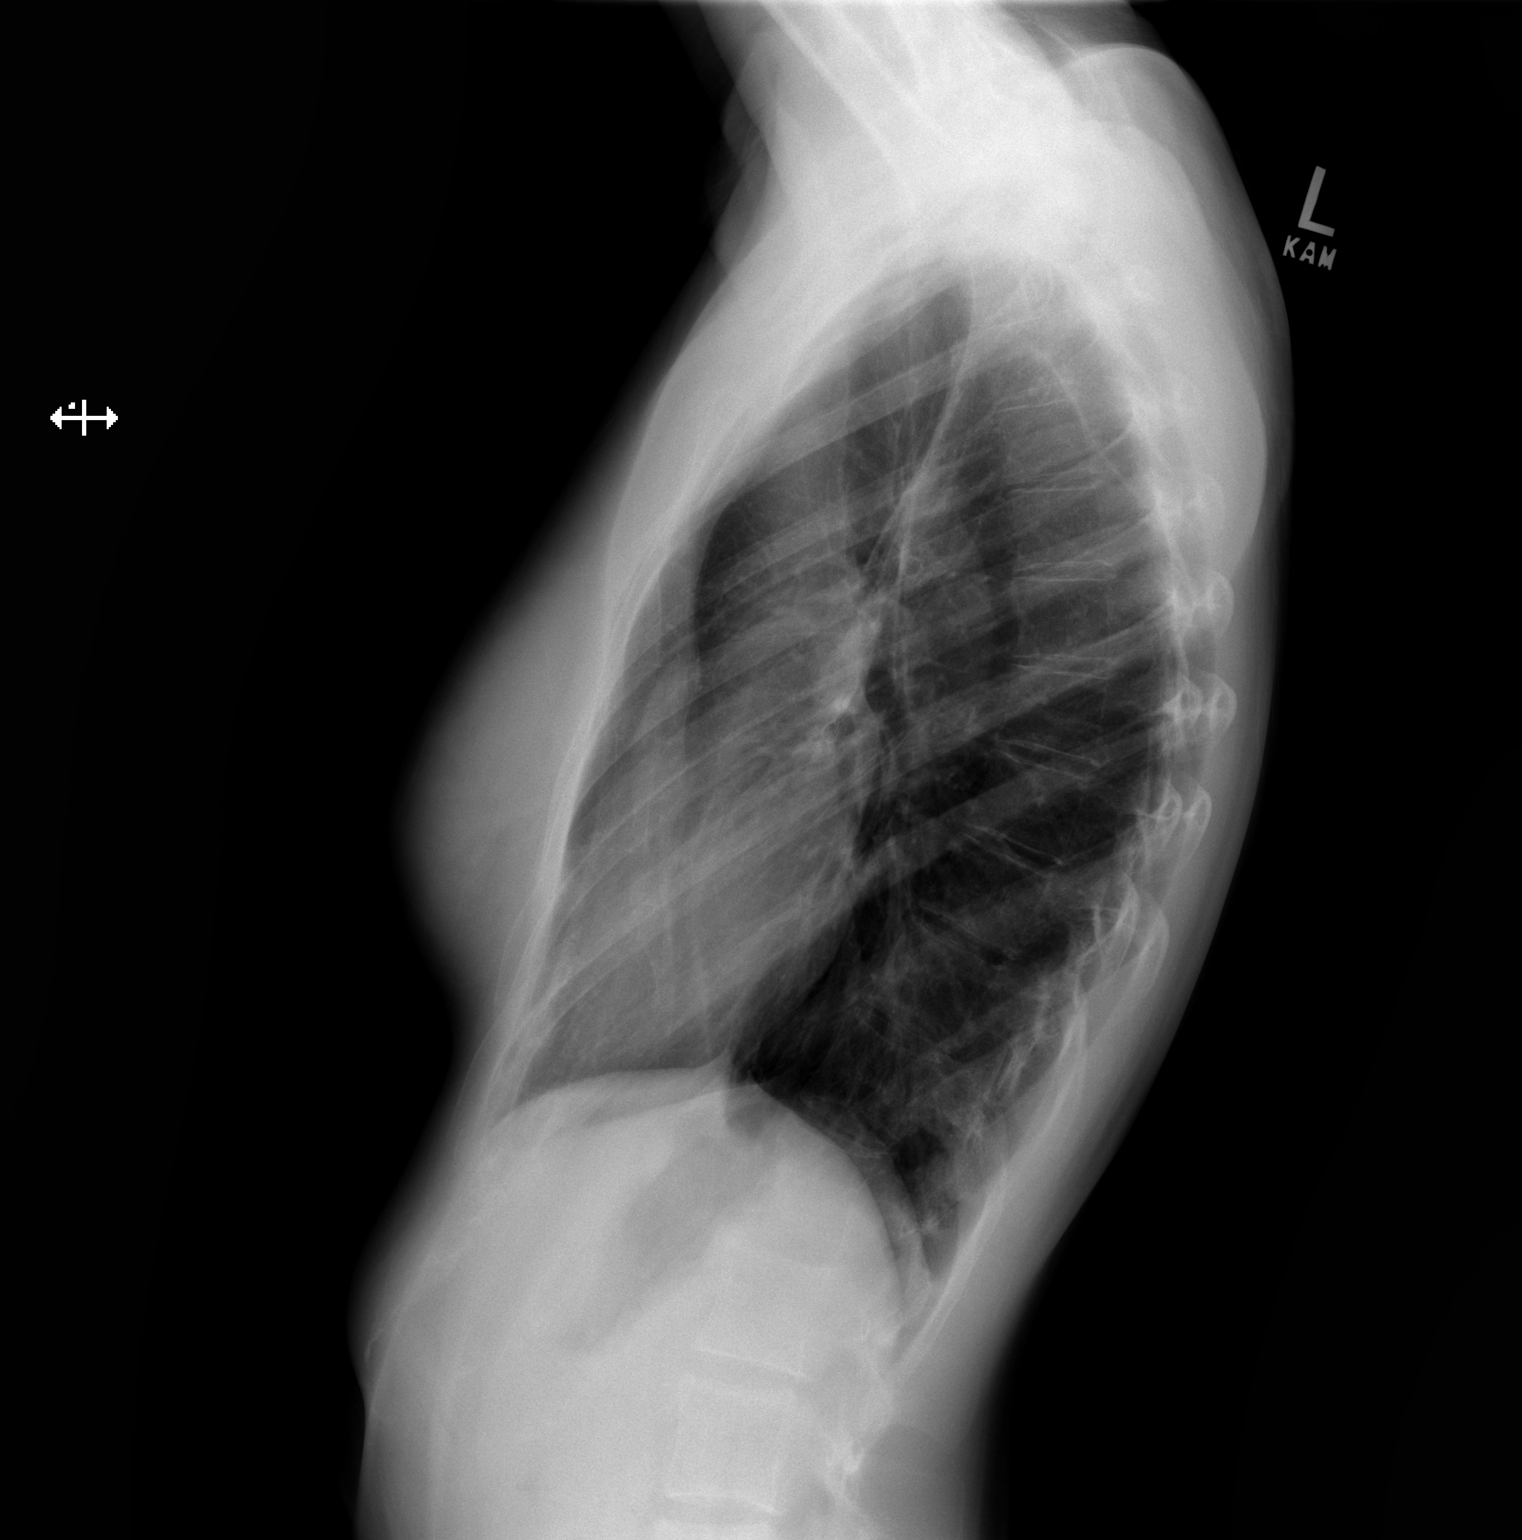

[2 of 2 positions shown; findings below may reference images not displayed]

FINDINGS: Normal heart size and mediastinal contours. The lungs are clear.
Similar hazy opacity in the right lower lung zone likely secondary
to pectus excavatum. No consolidation, pulmonary edema, pleural
fluid or pneumothorax. Levo scoliotic curvature of the lower
thoracic and upper lumbar spine. Pectus excavatum deformity of the
thoracic cage.
IMPRESSION: Pectus excavatum deformity and scoliosis accounting for vague
density at the right lower lung zone, unchanged from prior. No new
abnormality, otherwise negative.

## 2019-11-09 HISTORY — PX: WISDOM TOOTH EXTRACTION: SHX21

## 2021-07-29 NOTE — H&P (Signed)
Preoperative History & Physical Exam  Surgeon: Philipp Ovens, MD  Diagnosis: Right wrist dorsal carpal ganglion  Planned Procedure: Procedure(s) (LRB): Right wrist dorsal carpal ganglion excision (Right)  History of Present Illness:   Patient is a 25 y.o. female with symptoms consistent with  Right wrist dorsal carpal ganglion who presents for surgical intervention. The risks, benefits and alternatives of surgical intervention were discussed and informed consent was obtained prior to surgery.  Past Medical History: No past medical history on file.  Past Surgical History: none pertient  Medications:  Prior to Admission medications   Not on File    Allergies:  Patient has no allergy information on record.  Review of Systems: Negative except per HPI.  Physical Exam: Alert and oriented, NAD Head and neck: no masses, normal alignment CV: pulse intact Pulm: no increased work of breathing, respirations even and unlabored Abdomen: non-distended Extremities: extremities warm and well perfused  LABS: No results found for this or any previous visit (from the past 2160 hour(s)).   Complete History and Physical exam available in the office notes  Gomez Cleverly

## 2021-10-28 ENCOUNTER — Encounter (HOSPITAL_BASED_OUTPATIENT_CLINIC_OR_DEPARTMENT_OTHER): Payer: Self-pay | Admitting: Orthopedic Surgery

## 2021-10-28 ENCOUNTER — Other Ambulatory Visit: Payer: Self-pay

## 2021-10-28 NOTE — Progress Notes (Signed)
Spoke w/ via phone for pre-op interview--- [facility] needs dos----UPT               Lab results------ COVID test -----patient states asymptomatic no test needed Arrive at -------0530 NPO after MN NO Solid Food.  Clear liquids from MN until--- Med rec completed Medications to take morning of surgery -----NONE Diabetic medication ----- Patient instructed no nail polish to be worn day of surgery Patient instructed to bring photo id and insurance card day of surgery Patient aware to have Driver (ride ) / caregiver    for 24 hours after surgery  Patient Special Instructions ----- Pre-Op special Istructions ----- Patient verbalized understanding of instructions that were given at this phone interview. Patient denies shortness of breath, chest pain, fever, cough at this phone interview.

## 2021-11-04 NOTE — Anesthesia Preprocedure Evaluation (Addendum)
Anesthesia Evaluation  Patient identified by MRN, date of birth, ID band Patient awake    Reviewed: Allergy & Precautions, NPO status , Patient's Chart, lab work & pertinent test results  History of Anesthesia Complications Negative for: history of anesthetic complications  Airway Mallampati: I  TM Distance: >3 FB Neck ROM: Full    Dental  (+) Dental Advisory Given, Teeth Intact   Pulmonary neg pulmonary ROS,    Pulmonary exam normal        Cardiovascular negative cardio ROS Normal cardiovascular exam     Neuro/Psych negative neurological ROS  negative psych ROS   GI/Hepatic negative GI ROS, Neg liver ROS,   Endo/Other  negative endocrine ROS  Renal/GU negative Renal ROS     Musculoskeletal negative musculoskeletal ROS (+)   Abdominal   Peds  Hematology negative hematology ROS (+)   Anesthesia Other Findings   Reproductive/Obstetrics                            Anesthesia Physical Anesthesia Plan  ASA: 1  Anesthesia Plan: Regional   Post-op Pain Management: Tylenol PO (pre-op) and Regional block   Induction:   PONV Risk Score and Plan: 2 and Propofol infusion, Treatment may vary due to age or medical condition, Midazolam and Ondansetron  Airway Management Planned: Natural Airway and Simple Face Mask  Additional Equipment: None  Intra-op Plan:   Post-operative Plan:   Informed Consent: I have reviewed the patients History and Physical, chart, labs and discussed the procedure including the risks, benefits and alternatives for the proposed anesthesia with the patient or authorized representative who has indicated his/her understanding and acceptance.       Plan Discussed with: CRNA and Anesthesiologist  Anesthesia Plan Comments:        Anesthesia Quick Evaluation

## 2021-11-05 ENCOUNTER — Encounter (HOSPITAL_BASED_OUTPATIENT_CLINIC_OR_DEPARTMENT_OTHER)
Admission: RE | Disposition: A | Discharge status: Home / Self Care | Payer: Self-pay | Source: Ambulatory Visit | Attending: Orthopedic Surgery

## 2021-11-05 ENCOUNTER — Ambulatory Visit (HOSPITAL_BASED_OUTPATIENT_CLINIC_OR_DEPARTMENT_OTHER): Payer: 59 | Admitting: Anesthesiology

## 2021-11-05 ENCOUNTER — Ambulatory Visit (HOSPITAL_BASED_OUTPATIENT_CLINIC_OR_DEPARTMENT_OTHER)
Admission: RE | Admit: 2021-11-05 | Discharge: 2021-11-05 | Disposition: A | Discharge status: Home / Self Care | Payer: 59 | Source: Ambulatory Visit | Attending: Orthopedic Surgery | Admitting: Orthopedic Surgery

## 2021-11-05 ENCOUNTER — Encounter (HOSPITAL_BASED_OUTPATIENT_CLINIC_OR_DEPARTMENT_OTHER): Payer: Self-pay | Admitting: Orthopedic Surgery

## 2021-11-05 DIAGNOSIS — M67431 Ganglion, right wrist: Secondary | ICD-10-CM | POA: Insufficient documentation

## 2021-11-05 HISTORY — PX: GANGLION CYST EXCISION: SHX1691

## 2021-11-05 LAB — POCT PREGNANCY, URINE: Preg Test, Ur: NEGATIVE

## 2021-11-05 SURGERY — EXCISION, GANGLION CYST, WRIST
Anesthesia: Regional | Site: Wrist | Laterality: Right

## 2021-11-05 MED ORDER — LIDOCAINE 2% (20 MG/ML) 5 ML SYRINGE
INTRAMUSCULAR | Status: AC
  Filled 2021-11-05: qty 5

## 2021-11-05 MED ORDER — OXYCODONE HCL 5 MG/5ML PO SOLN: 5.0000 mg | Freq: Once | ORAL | Status: DC | PRN

## 2021-11-05 MED ORDER — PROPOFOL 500 MG/50ML IV EMUL
INTRAVENOUS | Status: DC | PRN
  Administered 2021-11-05: 200 ug/kg/min via INTRAVENOUS

## 2021-11-05 MED ORDER — FENTANYL CITRATE (PF) 100 MCG/2ML IJ SOLN
INTRAMUSCULAR | Status: AC
  Filled 2021-11-05: qty 2

## 2021-11-05 MED ORDER — CEFAZOLIN SODIUM-DEXTROSE 2-4 GM/100ML-% IV SOLN
2.0000 g | INTRAVENOUS | Status: AC
  Administered 2021-11-05: 08:00:00 2 g via INTRAVENOUS

## 2021-11-05 MED ORDER — LACTATED RINGERS IV SOLN: INTRAVENOUS | Status: DC

## 2021-11-05 MED ORDER — PROMETHAZINE HCL 25 MG/ML IJ SOLN: 6.2500 mg | INTRAMUSCULAR | Status: DC | PRN

## 2021-11-05 MED ORDER — MIDAZOLAM HCL 2 MG/2ML IJ SOLN
INTRAMUSCULAR | Status: AC
  Filled 2021-11-05: qty 2

## 2021-11-05 MED ORDER — MIDAZOLAM HCL 2 MG/2ML IJ SOLN
INTRAMUSCULAR | Status: DC | PRN
  Administered 2021-11-05: 1 mg via INTRAVENOUS

## 2021-11-05 MED ORDER — FENTANYL CITRATE (PF) 100 MCG/2ML IJ SOLN
INTRAMUSCULAR | Status: DC | PRN
  Administered 2021-11-05: 25 ug via INTRAVENOUS

## 2021-11-05 MED ORDER — FENTANYL CITRATE (PF) 100 MCG/2ML IJ SOLN: 25.0000 ug | INTRAMUSCULAR | Status: DC | PRN

## 2021-11-05 MED ORDER — FENTANYL CITRATE (PF) 100 MCG/2ML IJ SOLN
50.0000 ug | Freq: Once | INTRAMUSCULAR | Status: AC
  Administered 2021-11-05: 07:00:00 50 ug via INTRAVENOUS

## 2021-11-05 MED ORDER — MIDAZOLAM HCL 2 MG/2ML IJ SOLN
2.0000 mg | Freq: Once | INTRAMUSCULAR | Status: AC
  Administered 2021-11-05: 07:00:00 2 mg via INTRAVENOUS

## 2021-11-05 MED ORDER — ACETAMINOPHEN 500 MG PO TABS
1000.0000 mg | ORAL_TABLET | Freq: Once | ORAL | Status: AC
  Administered 2021-11-05: 07:00:00 1000 mg via ORAL

## 2021-11-05 MED ORDER — LIDOCAINE 2% (20 MG/ML) 5 ML SYRINGE
INTRAMUSCULAR | Status: DC | PRN
  Administered 2021-11-05: 40 mg via INTRAVENOUS

## 2021-11-05 MED ORDER — OXYCODONE HCL 5 MG PO TABS: 5.0000 mg | ORAL_TABLET | Freq: Once | ORAL | Status: DC | PRN

## 2021-11-05 MED ORDER — PROPOFOL 10 MG/ML IV BOLUS
INTRAVENOUS | Status: DC | PRN
  Administered 2021-11-05 (×2): 40 mg via INTRAVENOUS

## 2021-11-05 MED ORDER — BUPIVACAINE-EPINEPHRINE (PF) 0.5% -1:200000 IJ SOLN
INTRAMUSCULAR | Status: DC | PRN
  Administered 2021-11-05: 30 mL via PERINEURAL

## 2021-11-05 MED ORDER — 0.9 % SODIUM CHLORIDE (POUR BTL) OPTIME
TOPICAL | Status: DC | PRN
  Administered 2021-11-05: 08:00:00 500 mL

## 2021-11-05 SURGICAL SUPPLY — 33 items
BLADE CLIPPER SENSICLIP SURGIC (BLADE) ×2 IMPLANT
BLADE SURG 15 STRL LF DISP TIS (BLADE) ×1 IMPLANT
BLADE SURG 15 STRL SS (BLADE) ×3
BNDG CMPR 9X4 STRL LF SNTH (GAUZE/BANDAGES/DRESSINGS) ×1
BNDG ELASTIC 4X5.8 VLCR STR LF (GAUZE/BANDAGES/DRESSINGS) ×3 IMPLANT
BNDG ESMARK 4X9 LF (GAUZE/BANDAGES/DRESSINGS) ×3 IMPLANT
CORD BIPOLAR FORCEPS 12FT (ELECTRODE) ×3 IMPLANT
COVER BACK TABLE 60X90IN (DRAPES) ×3 IMPLANT
CUFF TOURN SGL QUICK 18 (TOURNIQUET CUFF) ×2 IMPLANT
DRAPE EXTREMITY T 121X128X90 (DISPOSABLE) ×3 IMPLANT
DRAPE SHEET LG 3/4 BI-LAMINATE (DRAPES) ×3 IMPLANT
DRAPE SURG 17X23 STRL (DRAPES) ×3 IMPLANT
GAUZE 4X4 16PLY ~~LOC~~+RFID DBL (SPONGE) ×3 IMPLANT
GAUZE SPONGE 4X4 12PLY STRL (GAUZE/BANDAGES/DRESSINGS) ×3 IMPLANT
GAUZE XEROFORM 1X8 LF (GAUZE/BANDAGES/DRESSINGS) ×3 IMPLANT
GLOVE SURG MICRO LTX SZ7.5 (GLOVE) ×2 IMPLANT
GLOVE SURG UNDER POLY LF SZ7 (GLOVE) ×2 IMPLANT
GOWN STRL REUS W/ TWL LRG LVL3 (GOWN DISPOSABLE) ×1 IMPLANT
GOWN STRL REUS W/TWL LRG LVL3 (GOWN DISPOSABLE) ×5 IMPLANT
HIBICLENS CHG 4% 4OZ BTL (MISCELLANEOUS) ×3 IMPLANT
KIT TURNOVER CYSTO (KITS) ×3 IMPLANT
NDL HYPO 25X1 1.5 SAFETY (NEEDLE) ×1 IMPLANT
NEEDLE HYPO 25X1 1.5 SAFETY (NEEDLE) ×3 IMPLANT
NS IRRIG 500ML POUR BTL (IV SOLUTION) ×2 IMPLANT
PACK BASIN DAY SURGERY FS (CUSTOM PROCEDURE TRAY) ×3 IMPLANT
PAD CAST 4YDX4 CTTN HI CHSV (CAST SUPPLIES) ×1 IMPLANT
PADDING CAST COTTON 4X4 STRL (CAST SUPPLIES) ×3
SLING ARM FOAM STRAP MED (SOFTGOODS) ×2 IMPLANT
SUT ETHILON 4 0 PS 2 18 (SUTURE) ×2 IMPLANT
SYR BULB EAR ULCER 3OZ GRN STR (SYRINGE) ×3 IMPLANT
TOWEL OR 17X26 10 PK STRL BLUE (TOWEL DISPOSABLE) ×3 IMPLANT
TRAY DSU PREP LF (CUSTOM PROCEDURE TRAY) ×3 IMPLANT
UNDERPAD 30X36 HEAVY ABSORB (UNDERPADS AND DIAPERS) ×3 IMPLANT

## 2021-11-05 NOTE — Op Note (Signed)
OPERATIVE NOTE  DATE OF PROCEDURE: 11/05/2021  SURGEONS:  Primary: Orene Desanctis, MD  PREOPERATIVE DIAGNOSIS: Right wrist dorsal carpal ganglion  POSTOPERATIVE DIAGNOSIS: Same  NAME OF PROCEDURE:   Right Dorsal Carpal Ganglion Cyst Excision  ANESTHESIA: Regional  SKIN PREPARATION: Hibiclens  ESTIMATED BLOOD LOSS: Minimal  SPECIMEN: no  INDICATIONS:  Abigail Pennington is a 25 y.o. female who has the above preoperative diagnosis. The patient has decided to proceed with surgical intervention.  Risks, benefits and alternatives of operative management were discussed including, but not limited to, risks of anesthesia complications, infection, pain, persistent symptoms, stiffness, need for future surgery.  The patient understands, agrees and elects to proceed with surgery.    DESCRIPTION OF PROCEDURE: The patient was met in the pre-operative area and their identity was verified.  The operative location and laterality was also verified and marked.  The patient was brought to the OR and was placed supine on the table.  After repeat patient identification with the operative team anesthesia was provided and the patient was prepped and draped in the usual sterile fashion.  A final timeout was performed verifying the correction patient, procedure, location and laterality.  Preoperative IV antibiotics were provided. The right upper extremity was elevated and exsanguinated with an esmarch and tourniquet inflated to 250 mmHg. A longitudinal incision was made over the cyst and skin and subcutaneous tissues were divided. The cyst was encountered and stalk was identified down to the level of the SL joint. The cyst contents were drained, the stalk and entire cyst sac were excised. A dorsal capsulectomy was performed. Care was taken to protect the extensor tendons. The specimen was then sent to pathology. The wound was then irrigated with normal saline. Closure was performed with 4-0 nylon suture in horizontal  mattress fashion. A sterile soft bandage was applied. The tourniquet was deflated and fingers pink and warm and well perfused. All counts were correct x 2. The patient tolerated the procedure well, was awoken from anesthesia and brought to PACU for recovery in stable condition.   Matt Holmes, MD

## 2021-11-05 NOTE — Progress Notes (Signed)
Assisted Dr. Brock with right, ultrasound guided, axillary block. Side rails up, monitors on throughout procedure. See vital signs in flow sheet. Tolerated Procedure well. 

## 2021-11-05 NOTE — Interval H&P Note (Signed)
History and Physical Interval Note:  11/05/2021 7:39 AM  Abigail Pennington  has presented today for surgery, with the diagnosis of Right wrist dorsal carpal ganglion.  The various methods of treatment have been discussed with the patient and family. After consideration of risks, benefits and other options for treatment, the patient has consented to  Procedure(s) with comments: Right wrist dorsal carpal ganglion excision (Right) - with MAC as a surgical intervention.  The patient's history has been reviewed, patient examined, no change in status, stable for surgery.  I have reviewed the patient's chart and labs.  Questions were answered to the patient's satisfaction.     Orene Desanctis

## 2021-11-05 NOTE — Anesthesia Postprocedure Evaluation (Signed)
Anesthesia Post Note  Patient: Abigail Pennington  Procedure(s) Performed: Right wrist dorsal carpal ganglion excision (Right: Wrist)     Patient location during evaluation: PACU Anesthesia Type: Regional Level of consciousness: awake and alert Pain management: pain level controlled Vital Signs Assessment: post-procedure vital signs reviewed and stable Respiratory status: spontaneous breathing, nonlabored ventilation and respiratory function stable Cardiovascular status: stable and blood pressure returned to baseline Anesthetic complications: no   No notable events documented.  Last Vitals:  Vitals:   11/05/21 0845 11/05/21 0900  BP: 131/85 137/84  Pulse: 79 94  Resp: (!) 23 (!) 24  Temp:    SpO2: 99% 97%    Last Pain:  Vitals:   11/05/21 0845  TempSrc:   PainSc: 0-No pain                 Beryle Lathe

## 2021-11-05 NOTE — Transfer of Care (Signed)
Immediate Anesthesia Transfer of Care Note  Patient: Abigail Pennington  Procedure(s) Performed: Procedure(s) (LRB): Right wrist dorsal carpal ganglion excision (Right)  Patient Location: PACU  Anesthesia Type: MAC  Level of Consciousness: awake, alert , oriented and patient cooperative  Airway & Oxygen Therapy: Patient Spontanous Breathing and Patient connected to [place] oxygen  Post-op Assessment: Report given to PACU RN and Post -op Vital signs reviewed and stable  Post vital signs: Reviewed and stable  Complications: No apparent anesthesia complications Last Vitals:  Vitals Value Taken Time  BP    Temp    Pulse 86 11/05/21 0824  Resp 28 11/05/21 0824  SpO2 99 % 11/05/21 0824  Vitals shown include unvalidated device data.  Last Pain:  Vitals:   11/05/21 0629  TempSrc: Oral  PainSc: 0-No pain      Patients Stated Pain Goal: 5 (59/56/38 7564)  Complications: No notable events documented.

## 2021-11-05 NOTE — Anesthesia Procedure Notes (Signed)
Anesthesia Regional Block: Axillary brachial plexus block   Pre-Anesthetic Checklist: , timeout performed,  Correct Patient, Correct Site, Correct Laterality,  Correct Procedure, Correct Position, site marked,  Risks and benefits discussed,  Surgical consent,  Pre-op evaluation,  At surgeon's request and post-op pain management  Laterality: Right  Prep: chloraprep       Needles:  Injection technique: Single-shot  Needle Type: Echogenic Needle     Needle Length: 5cm  Needle Gauge: 21     Additional Needles:   Narrative:  Start time: 11/05/2021 7:00 AM End time: 11/05/2021 7:04 AM Injection made incrementally with aspirations every 5 mL.  Performed by: Personally  Anesthesiologist: Beryle Lathe, MD  Additional Notes: No pain on injection. No increased resistance to injection. Injection made in 5cc increments. Good needle visualization. Patient tolerated the procedure well.

## 2021-11-05 NOTE — H&P (Signed)
Preoperative History & Physical Exam  Surgeon: Philipp Ovens, MD  Diagnosis: Right wrist dorsal carpal ganglion  Planned Procedure: Procedure(s) (LRB): Right wrist dorsal carpal ganglion excision (Right)  History of Present Illness:   Patient is a 25 y.o. female with symptoms consistent with  Right wrist dorsal carpal ganglion who presents for surgical intervention. The risks, benefits and alternatives of surgical intervention were discussed and informed consent was obtained prior to surgery.  Past Medical History: History reviewed. No pertinent past medical history.  Past Surgical History:  Past Surgical History:  Procedure Laterality Date   CYST EXCISION N/A 2015   WISDOM TOOTH EXTRACTION  2021    Medications:  Prior to Admission medications   Medication Sig Start Date End Date Taking? Authorizing Provider  LO LOESTRIN FE 1 MG-10 MCG / 10 MCG tablet Take 1 tablet by mouth daily. 08/11/16  Yes [provider]  Multiple Vitamin (MULTIVITAMIN) tablet Take 1 tablet by mouth daily. Olly for women grummies   Yes [provider]    Allergies:  Patient has no known allergies.  Review of Systems: Negative except per HPI.  Physical Exam: Alert and oriented, NAD Head and neck: no masses, normal alignment CV: pulse intact Pulm: no increased work of breathing, respirations even and unlabored Abdomen: non-distended Extremities: extremities warm and well perfused  LABS: Recent Results (from the past 2160 hour(s))  Pregnancy, urine POC     Status: None   Collection Time: 11/05/21  6:12 AM  Result Value Ref Range   Preg Test, Ur NEGATIVE NEGATIVE    Comment:        THE SENSITIVITY OF THIS METHODOLOGY IS >24 mIU/mL      Complete History and Physical exam available in the office notes  Gomez Cleverly

## 2021-11-05 NOTE — Discharge Instructions (Addendum)
Orthopaedic Hand Surgery Discharge Instructions  WEIGHT BEARING STATUS: Non weight bearing on operative extremity  INCISION CARE: Keep dressing over your incision clean and dry until 5 days after surgery. You may shower by placing a waterproof covering over your dressing. Once dressing is removed, you may allow water to run over the incision and then place Band-Aids over incision. Do not scrub your incision or apply creams/lotions. Do not submerge your incision or swim for 3 weeks after surgery. Contact your surgeon or primary care doctor if you develop redness or drainage from your incision.   PAIN CONTROL: First line medications for post operative pain control are Tylenol (acetaminophen) and Motrin (ibuprofen) if you are able to take these medications. If you have been prescribed a medication these can be taken as breakthrough pain medications. Please note that some narcotic pain medication have acetaminophen added and you should never consume more than 4,000mg  of acetaminophen in 24 hour period. Also please note that if you are given Toradol (ketoralac) you should not take similar medications simultaneously such as ibuprofen.   ICE/ELEVATION: Ice and elevate your injured extremity as needed. Avoid direct contact of ice with skin.  HOME MEDICATIONS: No changes have been made to your home medications.  FOLLOW UP: You will be called after surgery with an appointment date and time, however if you have not received a phone call within 3 days please call during regular office hours at [phone] to schedule a post operative appointment.  Please Seek Medical Attention if: Call MD for: pain or pressure in chest, jaw, arm, back, neck  Call MD for: temperature greater than 101 F for more than 24 hours  Call MD for: difficulty breathing Call MD for: Incision redness, bleeding, drainage  Call MD for: palpitations or feeling that the heart is racing  Call MD for: increased swelling in arm, leg, ankle,  or abdomen  Call MD for: lightheadedness, dizziness, fainting Go to ED or call 911 if: chest pain does not go away after 3 nitroglycerin doses taken 5 min apart  Go to ED or call 911 for: any uncontrolled bleeding  Go to ED or call 911 if: unable to reach physician  Discharge Medications: Allergies as of 11/05/2021   No Known Allergies      Medication List     STOP taking these medications    Lo Loestrin Fe 1 MG-10 MCG / 10 MCG tablet Generic drug: Norethindrone-Ethinyl Estradiol-Fe Biphas   multivitamin tablet          Mathis Dad, MD Orthopaedic Hand Surgeon [facility] Office number: [phone] [address] [place], [place] [address]    Post Anesthesia Home Care Instructions  Activity: Get plenty of rest for the remainder of the day. A responsible individual must stay with you for 24 hours following the procedure.  For the next 24 hours, DO NOT: -Drive a car -Advertising copywriter -Drink alcoholic beverages -Take any medication unless instructed by your physician -Make any legal decisions or sign important papers.  Meals: Start with liquid foods such as gelatin or soup. Progress to regular foods as tolerated. Avoid greasy, spicy, heavy foods. If nausea and/or vomiting occur, drink only clear liquids until the nausea and/or vomiting subsides. Call your physician if vomiting continues.  Special Instructions/Symptoms: Your throat may feel dry or sore from the anesthesia or the breathing tube placed in your throat during surgery. If this causes discomfort, gargle with warm salt water. The discomfort should disappear within 24 hours.  May  take Tylenol beginning at 2:30 PM as needed for pain.

## 2021-11-06 ENCOUNTER — Encounter (HOSPITAL_BASED_OUTPATIENT_CLINIC_OR_DEPARTMENT_OTHER): Payer: Self-pay | Admitting: Orthopedic Surgery
